# Patient Record
Sex: Female | Born: 1990 | Race: Asian | Hispanic: No | Marital: Single | Smoking: Never smoker
Health system: Southern US, Community
[De-identification: ages and names within clinical notes are randomized; demographics above are authoritative.]

---

## 2015-10-14 ENCOUNTER — Ambulatory Visit (INDEPENDENT_AMBULATORY_CARE_PROVIDER_SITE_OTHER): Payer: Self-pay

## 2015-10-14 ENCOUNTER — Ambulatory Visit (INDEPENDENT_AMBULATORY_CARE_PROVIDER_SITE_OTHER): Payer: Self-pay | Admitting: Urgent Care

## 2015-10-14 VITALS — BP 122/72 | HR 76 | Temp 98.2°F | Resp 17 | Ht 62.5 in | Wt 120.0 lb

## 2015-10-14 DIAGNOSIS — R059 Cough, unspecified: Secondary | ICD-10-CM

## 2015-10-14 DIAGNOSIS — R05 Cough: Secondary | ICD-10-CM

## 2015-10-14 DIAGNOSIS — R0789 Other chest pain: Secondary | ICD-10-CM

## 2015-10-14 LAB — POCT CBC
GRANULOCYTE PERCENT: 56.8 % (ref 37–80)
HCT, POC: 36.3 % — AB (ref 37.7–47.9)
Hemoglobin: 13.1 g/dL (ref 12.2–16.2)
Lymph, poc: 1.9 (ref 0.6–3.4)
MCH, POC: 31.7 pg — AB (ref 27–31.2)
MCHC: 36.1 g/dL — AB (ref 31.8–35.4)
MCV: 87.7 fL (ref 80–97)
MID (CBC): 0.4 (ref 0–0.9)
MPV: 7.2 fL (ref 0–99.8)
PLATELET COUNT, POC: 331 10*3/uL (ref 142–424)
POC Granulocyte: 3.1 (ref 2–6.9)
POC LYMPH %: 35.5 % (ref 10–50)
POC MID %: 7.7 %M (ref 0–12)
RBC: 4.13 M/uL (ref 4.04–5.48)
RDW, POC: 12.2 %
WBC: 5.4 10*3/uL (ref 4.6–10.2)

## 2015-10-14 MED ORDER — FAMOTIDINE 20 MG PO TABS: 20.0000 mg | ORAL_TABLET | Freq: Two times a day (BID) | ORAL | Status: DC

## 2015-10-14 NOTE — Progress Notes (Signed)
    MRN: [id number] DOB: 03/12/1991  Subjective:   Patient is presenting with her cousin, Hannah Munoz, who is translating for Hannah Munoz since patient speaks [place] only.  Hannah Munoz is a 25 y.o. female presenting for chief complaint of Chest Pain  Reports 1 week history of mid-sternal chest pain, shob. Her chest pain radiates to her back, constant, sharp in nature. Has an occasional cough, elicits chest pain. Denies fever, hemoptysis, night sweats, weight loss, n/v, abdominal pain. Patient went to Urgent Care in [place], was prescribed Anaprox which made her symptoms worse. Patient recently moved here from [place], has been here for 2 months. Eats spicy foods, rice, fried foods. Denies smoking cigarettes.  Hannah Munoz has a current medication list which includes the following prescription(s): naproxen. Also has No Known Allergies.  Hannah Munoz  has no past medical history on file. Also  has no past surgical history on file.  Denies family history of cancer, diabetes, HTN, HL, heart disease, stroke, mental illness.   Objective:   Vitals: BP 122/72 mmHg  Pulse 76  Temp(Src) 98.2 F (36.8 C) (Oral)  Resp 17  Ht 5' 2.5" (1.588 m)  Wt 120 lb (54.432 kg)  BMI 21.59 kg/m2  SpO2 99%  LMP 09/26/2015  Physical Exam  Constitutional: She is oriented to person, place, and time. She appears well-developed and well-nourished.  HENT:  Mouth/Throat: Oropharynx is clear and moist.  Eyes: No scleral icterus.  Neck: Normal range of motion. Neck supple.  Cardiovascular: Normal rate, regular rhythm and intact distal pulses.  Exam reveals no gallop and no friction rub.   No murmur heard. Pulmonary/Chest: No respiratory distress. She has no wheezes. She has no rales.  Abdominal: Soft. Bowel sounds are normal. She exhibits no distension and no mass. There is no tenderness.  Neurological: She is alert and oriented to person, place, and time.  Skin: Skin is warm and dry.   Results for orders placed or performed in  visit on 10/14/15 (from the past 24 hour(s))  POCT CBC     Status: Abnormal   Collection Time: 10/14/15 10:42 AM  Result Value Ref Range   WBC 5.4 4.6 - 10.2 K/uL   Lymph, poc 1.9 0.6 - 3.4   POC LYMPH PERCENT 35.5 10 - 50 %L   MID (cbc) 0.4 0 - 0.9   POC MID % 7.7 0 - 12 %M   POC Granulocyte 3.1 2 - 6.9   Granulocyte percent 56.8 37 - 80 %G   RBC 4.13 4.04 - 5.48 M/uL   Hemoglobin 13.1 12.2 - 16.2 g/dL   HCT, POC 40.936.3 (A) 81.137.7 - 47.9 %   MCV 87.7 80 - 97 fL   MCH, POC 31.7 (A) 27 - 31.2 pg   MCHC 36.1 (A) 31.8 - 35.4 g/dL   RDW, POC 91.412.2 %   Platelet Count, POC 331 142 - 424 K/uL   MPV 7.2 0 - 99.8 fL   No results found.   Assessment and Plan :   1. Atypical chest pain - Suspect that she is having GERD, will have patient start Pepcid, make dietary modifications. Recheck in 2-4 weeks. Hannah DusterMichelle, patient's cousin can be reached at [phone] for results.  Hannah BambergMario Jolan Mealor, PA-C [facility] [phone] 10/14/2015 10:29 AM

## 2015-10-14 NOTE — Patient Instructions (Addendum)
B?nh tro ng??c d? dy th?c qu?n, Ng??i l?n (Gastroesophageal Reflux Disease, Adult) Thng th??ng, th?c ?n di chuy?n xu?ng th?c qu?n v ? l?i d? dy ?? tiu ha. Tuy nhin, khi m?t ng??i b? b?nh tro ng??c d? dy th?c qu?n (GERD), th?c ?n v a xt trong d? dy tro ng??c tr? l?i th?c qu?n. Khi tnh tr?ng ny x?y ra, th?c qu?n b? lot v vim. D?n d?n, GERD c th? t?o ra nh?ng l? nh? (v?t lot) trn l?p nim m?c th?c qu?n.  NGUYN NHN Tnh tr?ng ny gy ra b?i m?t v?n ?? c?a ph?n c? gi?a th?c qu?n v d? dy (c? th?t th?c qu?n d??i, hay LES). Thng th??ng c? LES ?ng l?i sau khi th?c ?n ?i qua th?c qu?n vo d? dy. Khi LES b? y?u ho?c b?t th??ng, c? khng ?ng theo ?ng cch v ?i?u ? cho php th?c ?n v a xt d? dy tro ng??c tr? l?i th?c qu?n. LES c th? b? y?u do m?t s? ch?t ?n king nh?t ??nh, thu?c v cc tnh tr?ng b?nh l, bao g?m:  S? d?ng thu?c l.  Mang thai.  Thot v? honh.  S? d?ng nhi?u r??u.  M?t s? lo?i th?c ?n v ?? u?ng nh?t ??nh, nh? c ph, s c la, hnh v b?c h. CC Y?U T? NGUY C? Tnh tr?ng ny hay x?y ra h?n ?:  Nh?ng ng??i t?ng cn.  Nh?ng ng??i c cc b?nh ? m lin k?t.  Nh?ng ng??i s? d?ng thu?c NSAID. TRI?U CH?NG Nh?ng tri?u ch?ng c?a tnh tr?ng ny bao g?m:  ? nng.  Kh nu?t ho?c ?au khi nu?t.  C?m th?y nh? c m?t kh?i c?c trong c? h?ng.  C?m gic ??ng trong mi?ng.  H?i th? hi.  C nhi?u n??c b?t.  C?m gic kh ch?u trong b?ng ho?c ch??ng b?ng.  ? h?i.  ?au ng?c.  Kh th? ho?c th? kh kh.  Ho lin t?c (m?n tnh) ho?c ho vo ban ?m.  B? h?ng l?p men r?ng.  S?t cn. Nh?ng tnh tr?ng khc nhau c th? gy ?au ng?c. B?o ??m ph?i ??n khm chuyn gia ch?m [place] s?c kh?e n?u qu v? b? ?au ng?c. CH?N ?ON Chuyn gia ch?m [place] s?c kh?e c?a qu v? s? h?i v? b?nh s? v khm th?c th? cho qu v?. ?? xc ??nh qu v? b? GERD nh? hay n?ng, chuyn gia ch?m [place] s?c kh?e c?ng c th? theo di qu v? ?p ?ng v?i vi?c ?i?u tr? nh? th? no. Qu v? c?ng  c th? ph?i lm cc ki?m tra khc, bao g?m:  N?i soi ?? ki?m tra d? dy v th?c qu?n b?ng m?t camera nh?.  Ki?m tra ?o n?ng ?? a xt trong th?c qu?n c?a qu v?.  Ki?m tra ?o m?c p l?c ln th?c qu?n c?a qu v?.  Nu?t bari ho?c nu?t bari ?i?u ch?nh ?? hi?n th? hnh dng, kch th??c v ch?c n?ng c?a th?c qu?n c?a qu v?. ?I?U TR? M?c tiu c?a ?i?u tr? l gip gi?m cc tri?u ch?ng v trnh bi?n ch?ng. Vi?c ?i?u tr? b?nh ny c th? khc nhau ty thu?c m?c ?? n?ng c?a tri?u ch?ng. Chuyn gia ch?m [place] s?c kh?e c?a qu v? c th? khuy?n ngh?:  Thay ??i ch? ?? ?n.  Thu?c.  Ph?u thu?t. H??NG D?N CH?M [place] T?I NH Ch? ?? ?n  Tun th? m?t ch? ?? ?n theo khuy?n ngh? c?a chuyn gia ch?m [place] s?c kh?e. Vi?c ny c th? l  trnh cc th?c ?n v ?? u?ng nh?:  C ph v tr (c ho?c khng c caffeine).  ?? u?ng c ch?ar??u.  ?? u?ng t?ng l?c v ?? u?ng dng trong th? thao.  ?? u?ng c ga ho?c soda.  S c la v c ca.  B?c h v h??ng v? b?c h.  T?i v hnh.  C?i ng?a (Horseradish).  Cc th?c ?n nhi?u gia v? v a xt, bao g?m h?t tiu, b?t ?t, b?t ca ri, gi?m, n??c s?t cay, v n??c s?t barbecue.  N??c qu? ho?c qu? h? cam qut, ch?ng h?n nh? cam, chanh v chanh l cam.  Cc th?c ?n c c chua, nh? n??c x?t ??, ?t, n??c x?t salsa, v pizza km x?t ??  Th?c ?n chin v nhi?u ch?t bo, ch?ng h?n nh? bnh rn, khoai ty chin, khoai ty rn v n??c x?t nhi?u ch?t bo.  Th?t nhi?u ch?t bo, ch?ng h?n nh? hot dog (bnh m k?p xc xch) v cc lo?i th?t ?? v tr?ng nhi?u m?, ch?ng h?n nh? th?t n?c l?ng, xc xch, gi?m bng v th?t l?n xng khi.  Nh?ng s?n ph?m b? s?a giu ch?t bo, nh? s?a nguyn kem, b? v pho mt kem.  ?n cc b?a nh?, th??ng xuyn thay v cc b?a no.  Trnh u?ng nhi?u n??c khi qu v? ?n.  Trnh ?n trong kho?ng 2-3 gi? tr??c khi ?i ng?.  Trnh n?m xu?ng ngay sau khi ?n.  Khngt?p th? d?c ngay sau khi ?n. H??ng d?n chung  Ch  ??n nh?ng thay ??i v? tri?u ch?ng  c?a qu v?.  Ch? s? d?ng thu?c khng c?n k ??n v thu?c c?n k ??n theo ch? d?n c?a chuyn gia ch?m [place] s?c kh?e. Khng dng aspirin, ibuprofen, ho?c cc thu?c NSAID khc tr? khi chuyn gia ch?m [place] s?c kh?e c?a qu v? cho php.  Khng s? d?ng b?t k? s?n ph?m thu?c l no, bao g?m thu?c l d?ng ht, thu?c l d?ng nhai v thu?c l ?i?n t?. N?u qu v? c?n gip ?? ?? cai thu?c, hy h?i chuyn gia ch?m [place] s?c kh?e.  M?c qu?n o r?ng. Khng m?c ci g ch?t quanh eo m c th? t?o p l?c ln b?ng.  Nng (nng cao) ??u gi??ng c?a qu v? thm 6 inch (15 cm).  C? g?ng gi?m c?ng th?ng, ch?ng h?n nh? t?p yoga ho?c thi?n. N?u qu v? c?n gip ?? ?? gi?m c?ng th?ng, hy h?i chuyn gia ch?m [place] s?c kh?e.  N?u qu v? th?a cn, hy gi?m cn n?ng v? m?c c l?i cho s?c kh?e c?a qu v?. Hy h?i chuyn gia ch?m [place] s?c kh?e ?? ???c h??ng d?n v? m?c tiu gi?m cn an ton.  Tun th? t?t c? cc cu?c h?n khm l?i theo ch? d?n c?a chuyn gia ch?m [place] s?c kh?e. ?i?u ny c vai tr quan tr?ng. ?I KHM N?U:  Qu v? c cc tri?u ch?ng m?i.  Qu v? b? s?t cn khng r nguyn nhn.  Qu v? b? kh nu?t ho?c b? ?au khi nu?t.  Qu v? th? kh kh ho?c ho dai d?ng.  Cc tri?u ch?ng c?a qu v? khng c?i thi?n sau khi ???c ?i?u tr?Sander Nephew v? b? kh?n gi?ng. NGAY L?P T?C ?I KHM N?U:  Qu v? b? ?au ? cnh tay, c?, hm, r?ng ho?c l?ng.  Qu v? th?y ?? m? hi, chng m?t ho?c chong vng.  Qu v? b? ?au ng?c ho?c kh th?.  Qu v? nn v ch?t nn ra gi?ng nh? mu ho?c b c ph.  Qu v? b? ng?t.  Phn c?a qu v? c mu ho?c mu ?en.  Qu v? khng th? nu?t, u?ng hay ?n.   Thng tin ny khng nh?m m?c ?ch thay th? cho l?i khuyn m chuyn gia ch?m [place] s?c kh?e ni v?i qu v?. Hy b?o ??m qu v? ph?i th?o lu?n b?t k? v?n ?? g m qu v? c v?i chuyn gia ch?m [place] s?c kh?e c?a qu v?.   Document Released: 01/16/2005 Document Revised: 12/28/2014 Elsevier Interactive Patient Education [employer]2016 Elsevier Inc.     IF  you received an x-ray today, you will receive an invoice from [facility]. Please contact [facility] at [phone] with questions or concerns regarding your invoice.   IF you received labwork today, you will receive an invoice from [facility]. Please contact [facility] at [phone] with questions or concerns regarding your invoice.   Our billing staff will not be able to assist you with questions regarding bills from these companies.  You will be contacted with the lab results as soon as they are available. The fastest way to get your results is to activate your [facility] account. Instructions are located on the last page of this paperwork. If you have not heard from us regarding the results in 2 weeks, please contact this office.

## 2015-10-25 ENCOUNTER — Emergency Department (HOSPITAL_BASED_OUTPATIENT_CLINIC_OR_DEPARTMENT_OTHER): Payer: Self-pay

## 2015-10-25 ENCOUNTER — Emergency Department (HOSPITAL_BASED_OUTPATIENT_CLINIC_OR_DEPARTMENT_OTHER)
Admission: EM | Admit: 2015-10-25 | Discharge: 2015-10-25 | Disposition: A | Discharge status: Home / Self Care | Payer: Self-pay | Attending: Emergency Medicine | Admitting: Emergency Medicine

## 2015-10-25 ENCOUNTER — Encounter (HOSPITAL_BASED_OUTPATIENT_CLINIC_OR_DEPARTMENT_OTHER): Payer: Self-pay | Admitting: Emergency Medicine

## 2015-10-25 DIAGNOSIS — R0789 Other chest pain: Secondary | ICD-10-CM | POA: Insufficient documentation

## 2015-10-25 LAB — CBC WITH DIFFERENTIAL/PLATELET
Basophils Absolute: 0 10*3/uL (ref 0.0–0.1)
Basophils Relative: 1 %
Eosinophils Absolute: 0.4 10*3/uL (ref 0.0–0.7)
Eosinophils Relative: 7 %
HCT: 39.3 % (ref 36.0–46.0)
Hemoglobin: 13.5 g/dL (ref 12.0–15.0)
Lymphocytes Relative: 36 %
Lymphs Abs: 2.2 10*3/uL (ref 0.7–4.0)
MCH: 30.9 pg (ref 26.0–34.0)
MCHC: 34.4 g/dL (ref 30.0–36.0)
MCV: 89.9 fL (ref 78.0–100.0)
Monocytes Absolute: 0.5 10*3/uL (ref 0.1–1.0)
Monocytes Relative: 8 %
Neutro Abs: 2.9 10*3/uL (ref 1.7–7.7)
Neutrophils Relative %: 48 %
Platelets: 356 10*3/uL (ref 150–400)
RBC: 4.37 MIL/uL (ref 3.87–5.11)
RDW: 11.2 % — ABNORMAL LOW (ref 11.5–15.5)
WBC: 5.9 10*3/uL (ref 4.0–10.5)

## 2015-10-25 LAB — COMPREHENSIVE METABOLIC PANEL
ALT: 13 U/L — ABNORMAL LOW (ref 14–54)
AST: 17 U/L (ref 15–41)
Albumin: 4.3 g/dL (ref 3.5–5.0)
Alkaline Phosphatase: 52 U/L (ref 38–126)
Anion gap: 7 (ref 5–15)
BUN: 10 mg/dL (ref 6–20)
CO2: 29 mmol/L (ref 22–32)
Calcium: 9.1 mg/dL (ref 8.9–10.3)
Chloride: 101 mmol/L (ref 101–111)
Creatinine, Ser: 0.54 mg/dL (ref 0.44–1.00)
GFR calc Af Amer: >60 mL/min (ref >60)
GFR calc non Af Amer: >60 mL/min (ref >60)
Glucose, Bld: 89 mg/dL (ref 65–99)
Potassium: 3.7 mmol/L (ref 3.5–5.1)
Sodium: 137 mmol/L (ref 135–145)
Total Bilirubin: 0.6 mg/dL (ref 0.3–1.2)
Total Protein: 7.9 g/dL (ref 6.5–8.1)

## 2015-10-25 LAB — LIPASE, BLOOD: Lipase: 15 U/L (ref 11–51)

## 2015-10-25 LAB — TROPONIN I: Troponin I: <0.03 ng/mL (ref <0.03)

## 2015-10-25 MED ORDER — OMEPRAZOLE 20 MG PO CPDR
20.0000 mg | DELAYED_RELEASE_CAPSULE | Freq: Every day | ORAL | Status: DC
Start: 2015-10-25 — End: 2016-02-19

## 2015-10-25 MED ORDER — GI COCKTAIL ~~LOC~~
30.0000 mL | Freq: Once | ORAL | Status: AC
Start: 2015-10-25 — End: 2015-10-25
  Administered 2015-10-25: 30 mL via ORAL
  Filled 2015-10-25: qty 30

## 2015-10-25 NOTE — ED Notes (Addendum)
Pt c/o mid chest pain x 2 weeks. Pt seen at UC 2 weeks ago had neg blood work and neg chest xray, pain persists. Denies N/V, SOB. Pt was given pepcid, pt states it is not working.

## 2015-10-25 NOTE — ED Notes (Signed)
Patient transported to X-ray 

## 2015-10-25 NOTE — ED Notes (Signed)
Pt's family requesting ultrasound of the chest and abdomen.  PA notified and plans to speak to pt.

## 2015-10-25 NOTE — ED Provider Notes (Signed)
CSN: [id number]     Arrival date & time 10/25/15  0935 History   First MD Initiated Contact with Patient 10/25/15 [phone]     Chief Complaint  Patient presents with  . Chest Pain   HPI patient's cousin uses as translator   25 year old female presents today with complaints of upper abdomen and chest pain. Patient reports symptoms have been present for approximately 3 weeks, persistent, with no changes. Patient notes that she was seen in urgent care, was prescribed Anaprox which upset her stomach and did not relieve her pain. She followed up at urgent care again on 10/14/2015. At that time she was started on Pepcid with scheduled follow-up in 2-4 weeks.   Patient reports that since her last visit symptoms have persisted with no relief. Patient denies any exacerbating factors including eating and drinking, deep inspiration. Patient does report minor tenderness to palpation of the sternum, no abdominal tenderness. No history of the same. Nonsmoker, no significant cardiac personal or family history. Pt reports a cousin with a history of lung cancer.   She denies any fever, chills, cough, nausea vomiting, diaphoresis, dizziness, blood per rectum, dark stools, lower extremity swelling edema, recent surgery, malignancy, significant weight loss, night sweats, or any DVT/PE risk factors.   History reviewed. No pertinent past medical history. History reviewed. No pertinent past surgical history. No family history on file. Social History  Substance Use Topics  . Smoking status: Never Smoker   . Smokeless tobacco: None  . Alcohol Use: No   OB History    No data available     Review of Systems  All other systems reviewed and are negative.   Allergies  Review of patient's allergies indicates no known allergies.  Home Medications   Prior to Admission medications   Medication Sig Start Date End Date Taking? Authorizing Provider  famotidine (PEPCID) 20 MG tablet Take 1 tablet (20 mg total) by mouth 2  (two) times daily. 10/14/15   Wallis BambergMario Mani, PA-C  omeprazole (PRILOSEC) 20 MG capsule Take 1 capsule (20 mg total) by mouth daily. 10/25/15   Eyvonne MechanicJeffrey Lailoni Baquera, PA-C   BP 91/59 mmHg  Pulse 68  Temp(Src) 98.4 F (36.9 C) (Oral)  Resp 11  SpO2 100%  LMP 10/12/2015   Physical Exam  Constitutional: She is oriented to person, place, and time. She appears well-developed and well-nourished.  HENT:  Head: Normocephalic and atraumatic.  Mouth/Throat: Uvula is midline, oropharynx is clear and moist and mucous membranes are normal. No oropharyngeal exudate, posterior oropharyngeal edema or posterior oropharyngeal erythema.  Eyes: Conjunctivae are normal. Pupils are equal, round, and reactive to light. Right eye exhibits no discharge. Left eye exhibits no discharge. No scleral icterus.  Neck: Normal range of motion. No JVD present. No tracheal deviation present.  Cardiovascular: Normal rate, regular rhythm, normal heart sounds and intact distal pulses.  Exam reveals no gallop and no friction rub.   No murmur heard. Pulmonary/Chest: Effort normal. No stridor. No respiratory distress. She has no wheezes. She has no rales. She exhibits no tenderness.  TTP of sternum, no obvious abnormality   Abdominal: Soft. She exhibits no distension and no mass. There is no tenderness. There is no rebound and no guarding.  Musculoskeletal: She exhibits no edema or tenderness.  Neurological: She is alert and oriented to person, place, and time. Coordination normal.  Skin: Skin is warm and dry. No rash noted. No erythema. No pallor.  Psychiatric: She has a normal mood and affect. Her behavior is  normal. Judgment and thought content normal.  Nursing note and vitals reviewed.   ED Course  Procedures (including critical care time) Labs Review Labs Reviewed  CBC WITH DIFFERENTIAL/PLATELET - Abnormal; Notable for the following:    RDW 11.2 (*)    All other components within normal limits  COMPREHENSIVE METABOLIC PANEL -  Abnormal; Notable for the following:    ALT 13 (*)    All other components within normal limits  LIPASE, BLOOD  TROPONIN I    Imaging Review Dg Chest 2 View  10/25/2015  CLINICAL DATA:  Chest pain. EXAM: CHEST  2 VIEW COMPARISON:  10/14/2015. FINDINGS: Mediastinum and hilar structures normal. Lungs are clear. Heart size normal. No pleural effusion or pneumothorax. Degenerative changes thoracic spine. IMPRESSION: No acute or focal abnormality. Electronically Signed   By: Maisie Fushomas  Register   On: 10/25/2015 10:15   I have personally reviewed and evaluated these images and lab results as part of [facility] medical decision-making.   EKG Interpretation   Date/Time:  Wednesday October 25 2015 09:59:37 EDT Ventricular Rate:  70 PR Interval:    QRS Duration: 92 QT Interval:  397 QTC Calculation: 429 R Axis:   89 Text Interpretation:  Sinus rhythm Benign early repolarization No acute  ischemic changes  Confirmed by LIU MD, DANA ([id number]) on 10/25/2015 10:03:39  AM      MDM   Final diagnoses:  Chest wall pain    Labs: CBC, CMP,Lipase,   Imaging: DG chest  Consults:  Therapeutics:GI cocktail  Discharge Meds: Prilosec  Assessment/Plan:  25 year old female presents today with complaints of chest pain. Patient's presentation is most consistent with chest wall pain. She has tenderness to palpation of the sternum. She has no significant risk factors for ACS, PE or DVT. Patient is afebrile, nontoxic in no acute distress. Low suspicion for any infectious etiology. Chest x-ray shows no significant findings, labs reassuring. Patient's pain was not improved with GI cocktail. She denies any indigestion or history of acid reflux. Patient will be instructed to use Tylenol as needed for pain, she'll be started on a PPI in the event this indigestion. Patient will be referred to cardiology for continued workup of her atypical chest pain. Patient has no signs or symptoms consistent with dissection, or any acute  life-threatening intrathoracic etiology. Patient given strict return precautions, she verbalized understanding and agreement to today's plan had no further questions or concerns at the time discharge.  I was called back into the room By the patient with more questioning. Patient concerned that she has lung cancer as she has a distant relative that once had lung cancer. Patient is a nonsmoker, no risk factors, no weight loss, fever, night sweats, or any other concerning signs or symptoms that would indicate malignant etiology. She has no masses on her chest or along the lateral aspect of her breasts. Plain films here showed no gross abnormality. Labs showed no abnormalities. Patient requesting ultrasound of the chest, informed her that she would need follow-up with her primary care provider for continued workup. Patient requested EGD to look at her esophagus, informed her that that was not a capability of the emergency room and that she needed to follow up with her primary care or gastroenterologist for further management.         Eyvonne MechanicJeffrey Haylen Shelnutt, PA-C 10/25/15 1128  Lavera Guiseana Duo Liu, MD 10/25/15 [phone]

## 2015-10-25 NOTE — ED Notes (Signed)
PA at bedside.

## 2015-10-25 NOTE — Discharge Instructions (Signed)
Please follow-up with primary care provider, and cardiologist for further evaluation of your chest pain. If you experience any new or worsening signs or symptoms please return immediately.   Chest Wall Pain Chest wall pain is pain in or around the bones and muscles of your chest. Sometimes, an injury causes this pain. Sometimes, the cause may not be known. This pain may take several weeks or longer to get better. HOME CARE INSTRUCTIONS  Pay attention to any changes in your symptoms. Take these actions to help with your pain:   Rest as told by your health care provider.   Avoid activities that cause pain. These include any activities that use your chest muscles or your abdominal and side muscles to lift heavy items.   If directed, apply ice to the painful area:  Put ice in a plastic bag.  Place a towel between your skin and the bag.  Leave the ice on for 20 minutes, 2-3 times per day.  Take over-the-counter and prescription medicines only as told by your health care provider.  Do not use tobacco products, including cigarettes, chewing tobacco, and e-cigarettes. If you need help quitting, ask your health care provider.  Keep all follow-up visits as told by your health care provider. This is important. SEEK MEDICAL CARE IF:  You have a fever.  Your chest pain becomes worse.  You have new symptoms. SEEK IMMEDIATE MEDICAL CARE IF:  You have nausea or vomiting.  You feel sweaty or light-headed.  You have a cough with phlegm (sputum) or you cough up blood.  You develop shortness of breath.   This information is not intended to replace advice given to you by your health care provider. Make sure you discuss any questions you have with your health care provider.   Document Released: 04/08/2005 Document Revised: 12/28/2014 Document Reviewed: 07/04/2014 Elsevier Interactive Patient Education [employer]2016 Elsevier Inc.

## 2016-02-19 ENCOUNTER — Encounter: Payer: Self-pay | Admitting: Family Medicine

## 2016-02-19 ENCOUNTER — Ambulatory Visit (INDEPENDENT_AMBULATORY_CARE_PROVIDER_SITE_OTHER): Payer: BLUE CROSS/BLUE SHIELD | Admitting: Family Medicine

## 2016-02-19 VITALS — BP 96/58 | HR 69 | Temp 97.9°F | Ht 63.0 in | Wt 124.2 lb

## 2016-02-19 DIAGNOSIS — Z23 Encounter for immunization: Secondary | ICD-10-CM | POA: Diagnosis not present

## 2016-02-19 DIAGNOSIS — Z1159 Encounter for screening for other viral diseases: Secondary | ICD-10-CM | POA: Diagnosis not present

## 2016-02-19 DIAGNOSIS — R071 Chest pain on breathing: Secondary | ICD-10-CM

## 2016-02-19 DIAGNOSIS — Z114 Encounter for screening for human immunodeficiency virus [HIV]: Secondary | ICD-10-CM | POA: Diagnosis not present

## 2016-02-19 DIAGNOSIS — R0789 Other chest pain: Secondary | ICD-10-CM | POA: Insufficient documentation

## 2016-02-19 DIAGNOSIS — Z0000 Encounter for general adult medical examination without abnormal findings: Secondary | ICD-10-CM

## 2016-02-19 NOTE — Progress Notes (Signed)
Subjective:   Patient ID: Hannah Munoz    DOB: 30-Apr-1990, 25 y.o. female   MRN: [id number]  CC: Establishing Care  HPI: Hannah Munoz is a 25 y.o. female who presents to clinic today to establish care. Problems discussed today are as follows:  1. Preventative Care: no previous PCP when in [place]. 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 Says she received her vaccinations and wants the flu shot today. Denies ever receiving PAP screening. H/o sexual activity with boyfriend since last year but waiting until next year when he arrives in the [place] and they get married. Not on birthcontrol but open to options later next year before he arrives.   2. Musculoskeletal Chest Pain: onset 2 months ago and reproducible with palpation. Patient was seen in ED on 10/25/15 and treated with reflux medications without relief. Denies dyspnea, diaphoresis, fevers, left arm pain, nausea, vomiting.  3. Sedentary Lifestyle: patient says she walks rarely but is otherwise not active. 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 Says she has not changed her diet [place] the [place]. Avoids carb rich foods.  ROS: Complete ROS performed, see HPI for pertinent ROS.  PMFSH: Pertinent past medical, surgical, family, and social history were reviewed and updated as appropriate. Smoking status reviewed.  Medications reviewed. No current outpatient prescriptions on file.   No current facility-administered medications for this visit.     Objective:   BP (!) 96/58   Pulse 69   Temp 97.9 F (36.6 C) (Oral)   Ht 5\' 3"  (1.6 m)   Wt 124 lb 3.2 oz (56.3 kg)   SpO2 100%   BMI 22.00 kg/m  Vitals and nursing note reviewed.  General: well nourished, well developed, in no acute distress with non-toxic appearance HEENT: normocephalic, atraumatic, moist mucous membranes Neck: supple, non-tender without lymphadenopathy CV: regular rate and rhythm without murmurs, rubs, or gallops, no lower extremity edema,  chest tenderness upon palpation of central chest wall Lungs: clear to auscultation bilaterally with normal work of breathing Abdomen: soft, non-tender, non-distended, no masses or organomegaly palpable, normoactive bowel sounds Skin: warm, dry, no rashes or lesions, cap refill < 2 seconds Extremities: warm and well perfused, normal tone  Assessment & Plan:   Costochondral chest pain Uncontrolled. Onset 2 months ago. Was seen in ED but ACS r/o unremarkable with NSR EKG and neg troponin. PPI w/o relief of suspected reflux. CP reproducible on exam with palpation consistent with MSK etiology. - Tylenol (up to 3 g) or NSAIDs PRN for pain - Apply warm compress to chest wall - F/u as needed  Encounter for screening and preventative care Establishing as new pt. No previous PCP per pt. Immunizations do not indicate HBV vaccination and unsure about last Tdap. Due for annual flu. Does not have any chronic med problems or medications. No h/o PAP, not sexually active. - Flu shot given, will need Tdap during next visit (out of vaccine at present) - Discussed ways to exercise  - Educated patient on balancing a diet  - Dicussed birth control and safe sex practice - F/u in 1 month for PAP screen and Tdap vaccine  Orders Placed This Encounter  Procedures  . Flu Vaccine QUAD 36+ mos IM  . HIV antibody  . Hepatitis B surface antigen   No orders of the defined types were placed in this encounter.   Durward Parcelavid McMullen, DO [facility], PGY-1 02/19/2016 4:20 PM

## 2016-02-19 NOTE — Assessment & Plan Note (Addendum)
Establishing as new pt. No previous PCP per pt. Immunizations do not indicate HBV vaccination and unsure about last Tdap. Due for annual flu. Does not have any chronic med problems or medications. No h/o PAP, not sexually active. - Flu shot given, will need Tdap during next visit (out of vaccine at present) - Discussed ways to exercise  - Educated patient on balancing a diet  - Dicussed birth control and safe sex practice - HBsAb pending, if neg will vaccinate during next visit - F/u in 1 month for PAP screen, Tdap and HBV vaccine

## 2016-02-19 NOTE — Assessment & Plan Note (Addendum)
Uncontrolled. Onset 2 months ago. Was seen in ED but ACS r/o unremarkable with NSR EKG and neg troponin. PPI w/o relief of suspected reflux. CP reproducible on exam with palpation consistent with MSK etiology. - Tylenol (up to 3 g) or NSAIDs PRN for pain - Apply warm compress to chest wall - F/u as needed

## 2016-02-19 NOTE — Patient Instructions (Addendum)
R?t vui ???c g?p b?n hm nay. Vui lng xem bn d??i ?? xem l?i k? ho?ch c?a chng ti cho chuy?n th?m hm nay.  1. B?n ?ang lm t?t v ? nh?n ???c vaccin cm c?a b?n. Xin vui lng u?ng thu?c Tylenol ho?c Advil cho ?au ng?c c?a b?n n?u c?n. 2. Ti s? thng bo cho b?n v? k?t qu? xt nghi?m vim gan B. C th? b?n s? c?n ch?ng ng?a v?c-xin 3 m?i trong vng 6 thng. 3. Vui lng tr? l?i sau 1 thng ?? ki?m tra PAP c?a b?n.  Xin g?i cho phng m?ch t?i s? [phone] n?u cc tri?u ch?ng x?u ?i ho?c b?n c b?t c? lo ng?i no. ? l ni?m vui c?a ti khi g?p b?n. - Durward Parcelavid McMullen, DO [facility], PGY-1

## 2016-02-20 ENCOUNTER — Telehealth: Payer: Self-pay | Admitting: Family Medicine

## 2016-02-20 LAB — HIV ANTIBODY (ROUTINE TESTING W REFLEX): HIV: NONREACTIVE

## 2016-02-20 LAB — HEPATITIS B SURFACE ANTIGEN: Hepatitis B Surface Ag: NEGATIVE

## 2016-02-20 NOTE — Telephone Encounter (Signed)
Called and left voicemail using [employer] concerning hepatitis B lab results. Told the test was negative and patient could come to the clinic to get the HBV vaccination. Left clinic number to call back to schedule. -- Durward Parcelavid McMullen, DO [facility], PGY-1

## 2016-10-07 IMAGING — DX DG CHEST 2V
2 series · 2 of 2 positions shown · non-contrast
Comparison: None.

CLINICAL DATA: Left upper chest pain and shortness of breath.

EXAM:
CHEST  2 VIEW

[chest pa]
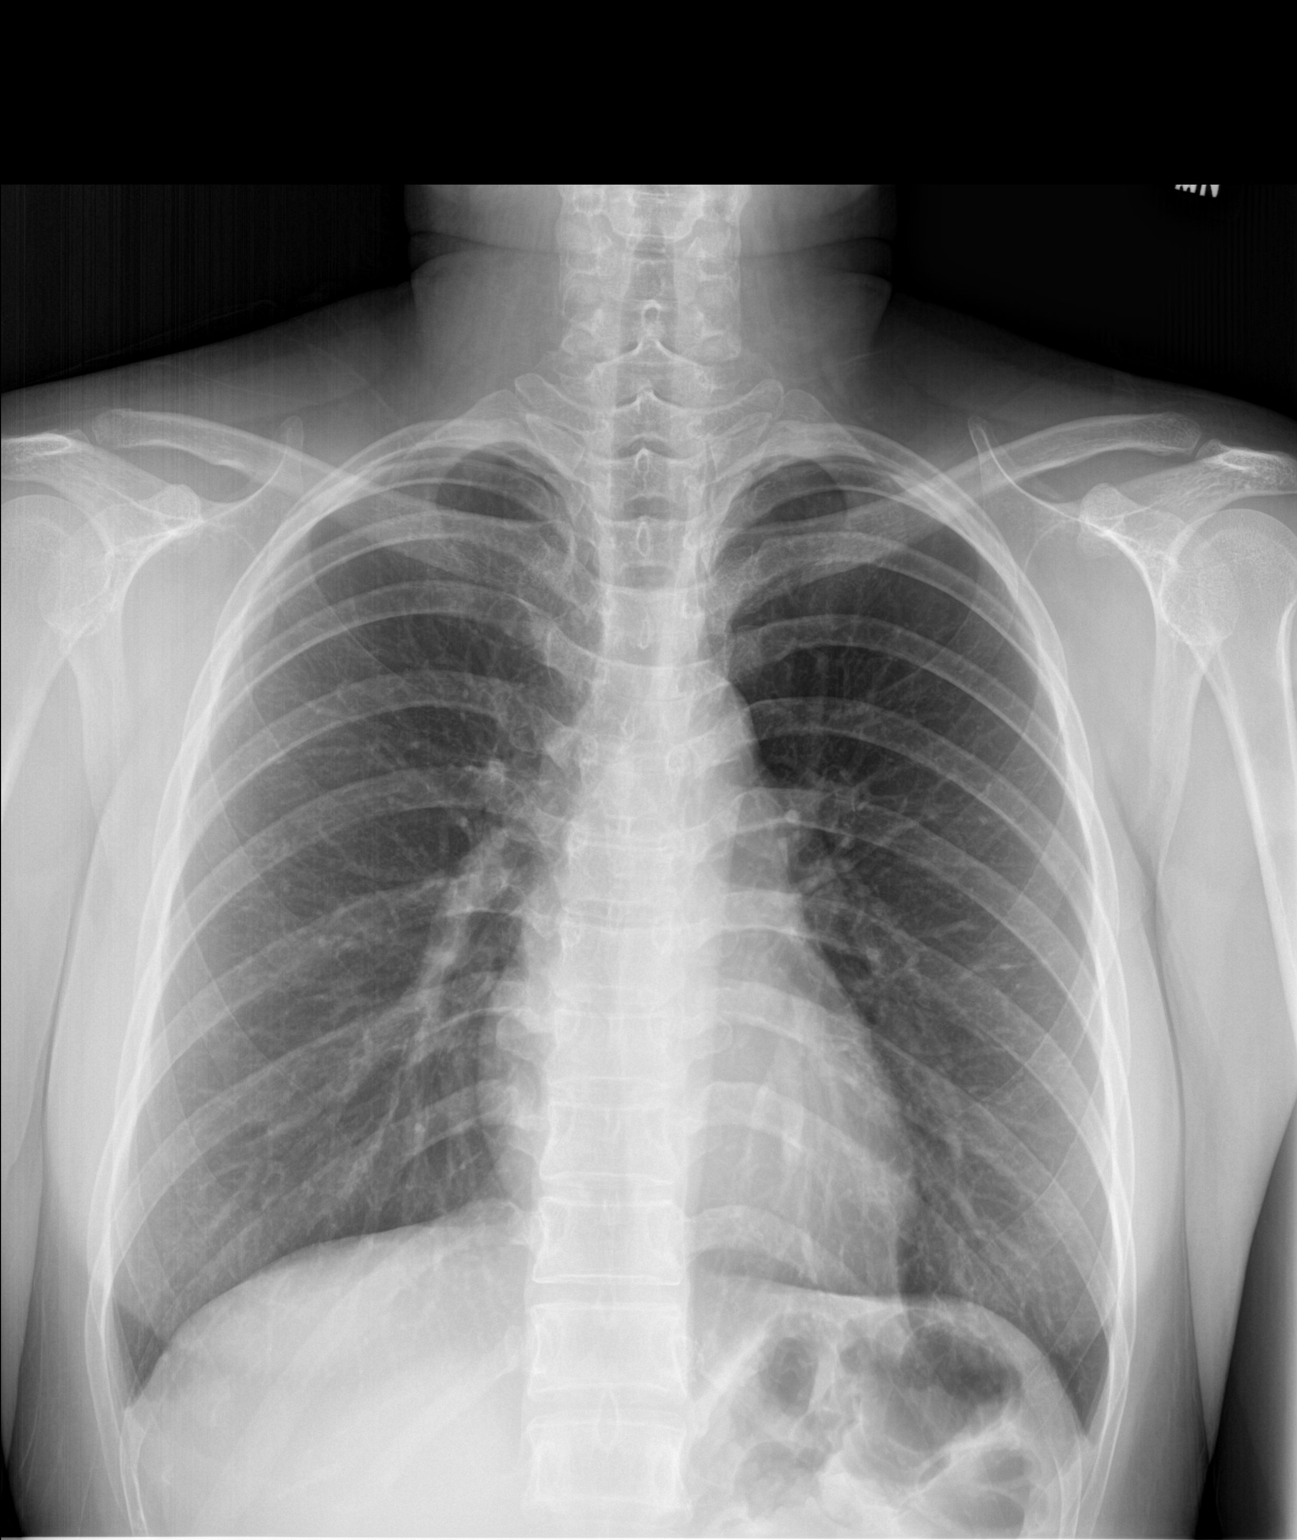

[chest lat]
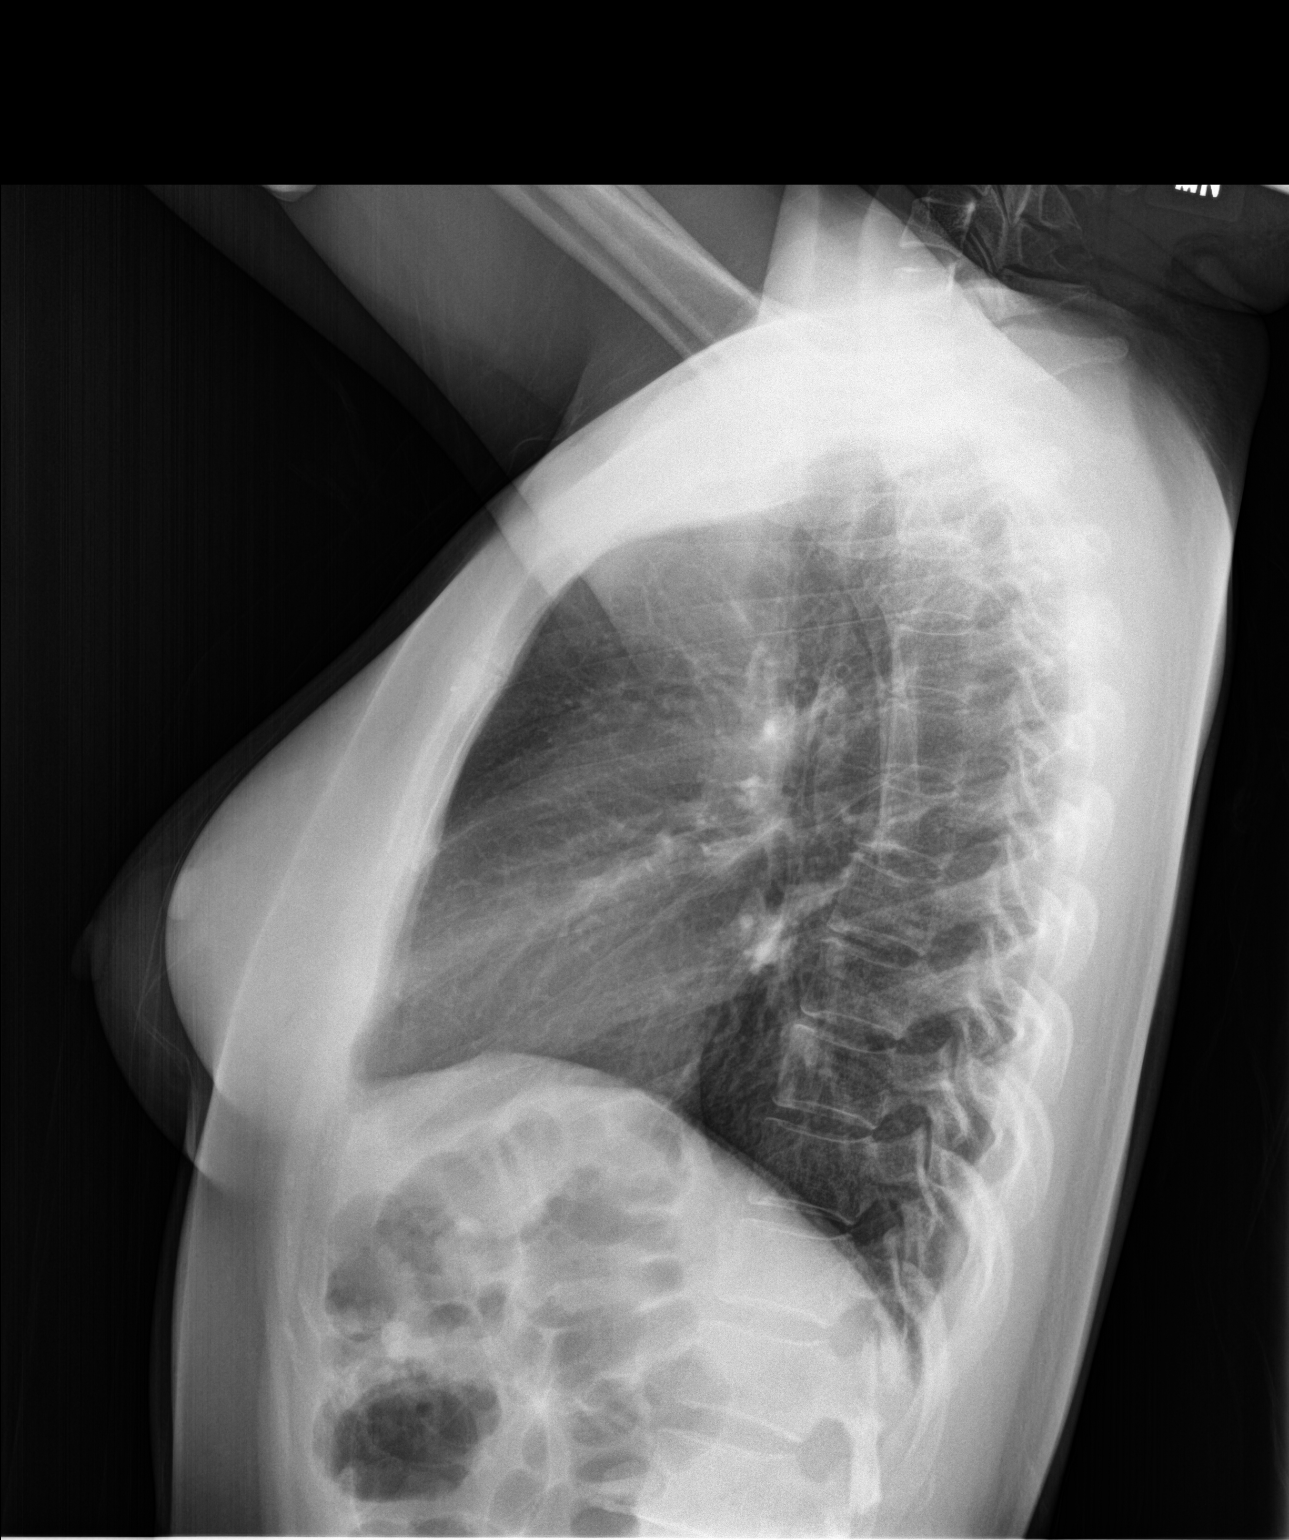

[2 of 2 positions shown; findings below may reference images not displayed]

FINDINGS: The heart size and mediastinal contours are within normal limits.
There is no evidence of pulmonary edema, consolidation,
pneumothorax, nodule or pleural fluid. The visualized skeletal
structures are unremarkable.
IMPRESSION: No active cardiopulmonary disease.

## 2016-10-18 IMAGING — DX DG CHEST 2V
2 series · 2 of 2 positions shown · non-contrast
Comparison: 10/14/2015.

CLINICAL DATA: Chest pain.

EXAM:
CHEST  2 VIEW

[chest pa]
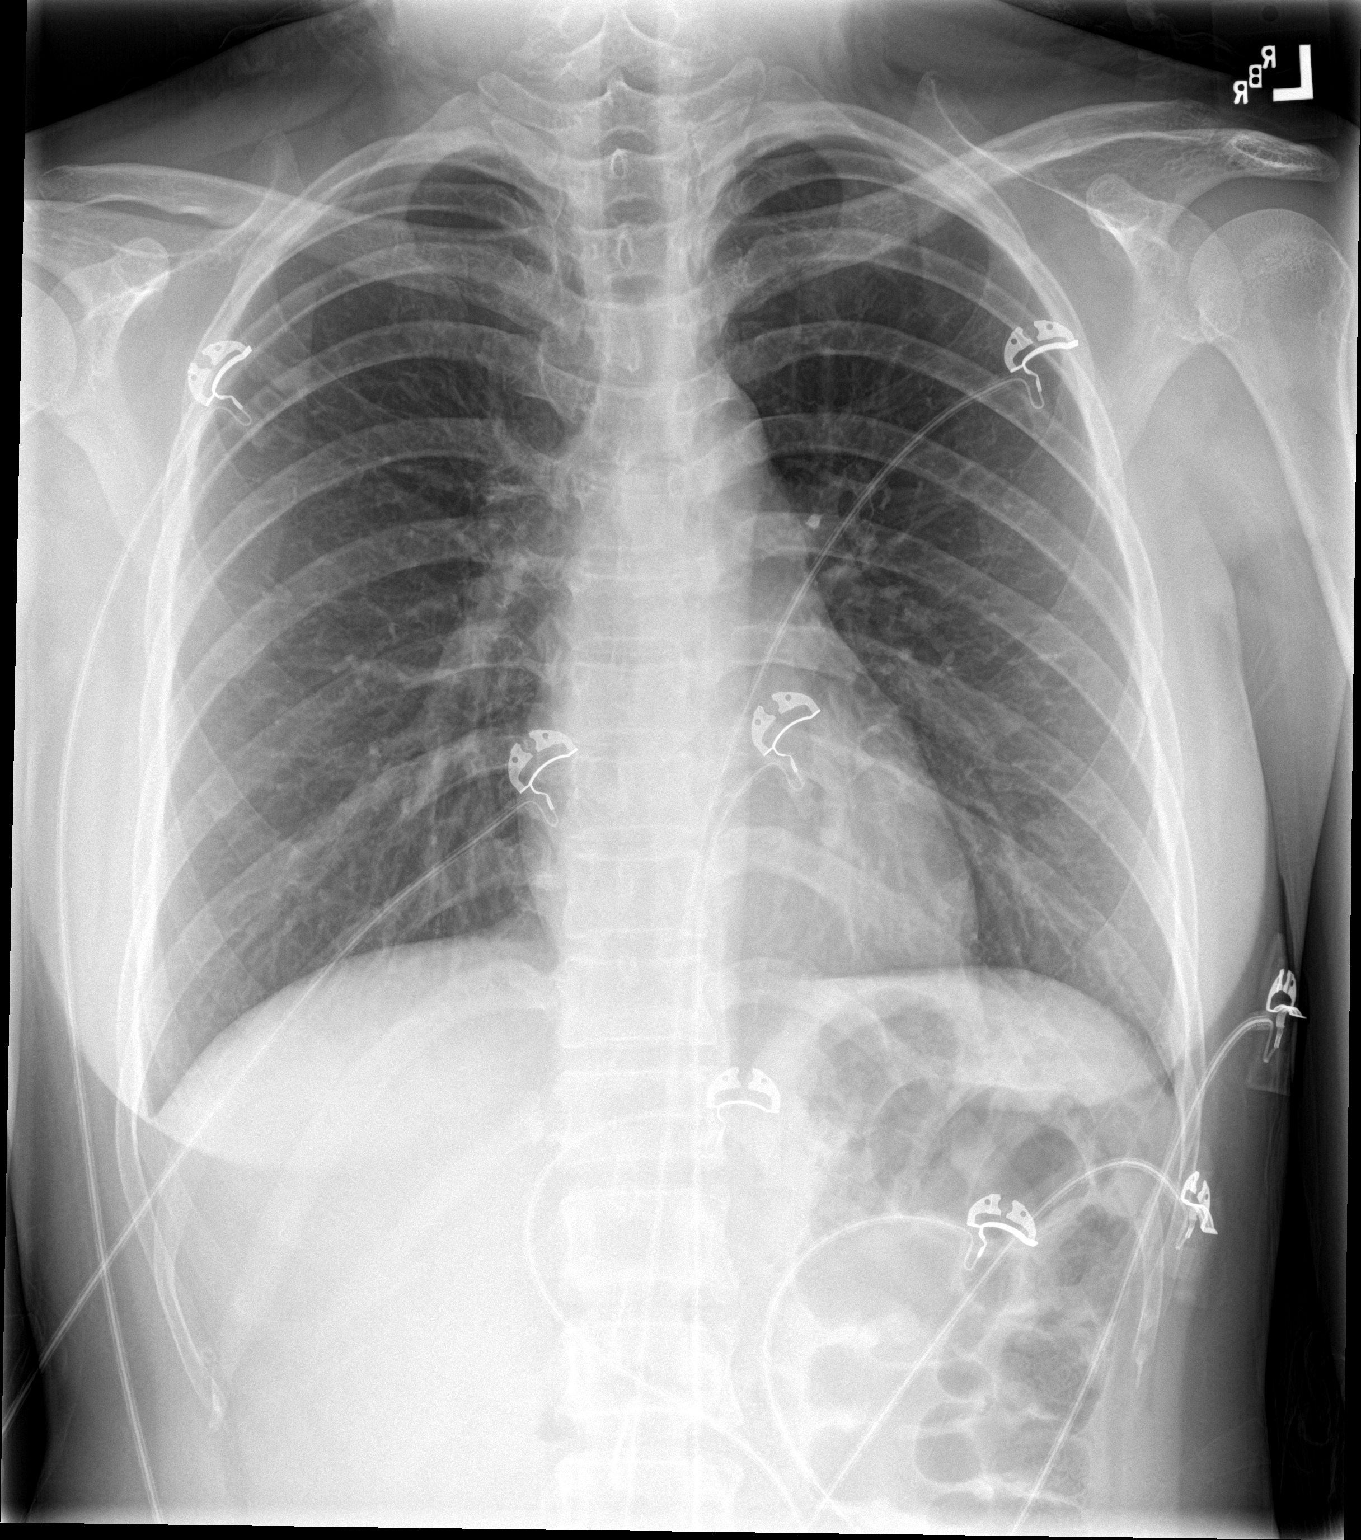

[chest lat]
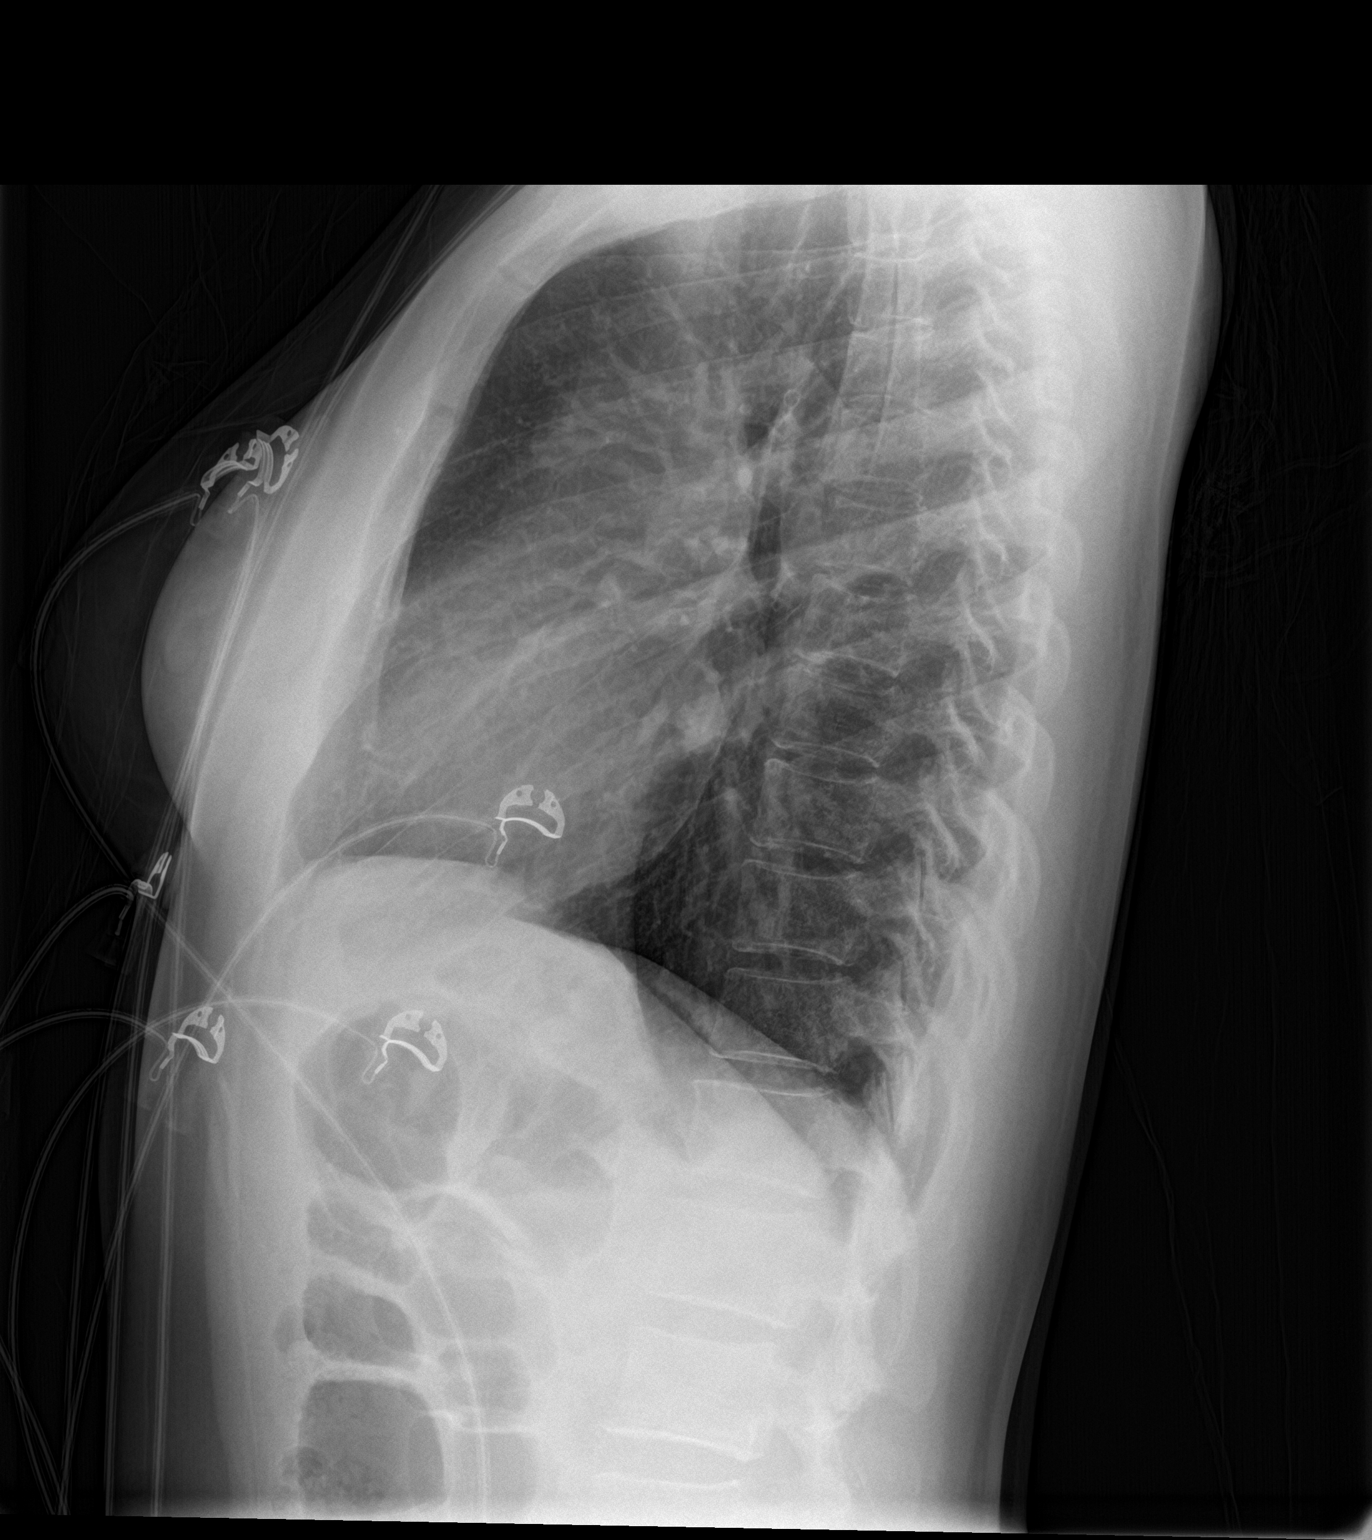

[2 of 2 positions shown; findings below may reference images not displayed]

FINDINGS: Mediastinum and hilar structures normal. Lungs are clear. Heart size
normal. No pleural effusion or pneumothorax. Degenerative changes
thoracic spine.
IMPRESSION: No acute or focal abnormality.

## 2018-03-23 ENCOUNTER — Ambulatory Visit (INDEPENDENT_AMBULATORY_CARE_PROVIDER_SITE_OTHER): Payer: BLUE CROSS/BLUE SHIELD | Admitting: Family Medicine

## 2018-03-23 VITALS — BP 99/80 | HR 86 | Temp 98.3°F | Wt 130.8 lb

## 2018-03-23 DIAGNOSIS — Z23 Encounter for immunization: Secondary | ICD-10-CM

## 2018-03-23 DIAGNOSIS — Z0000 Encounter for general adult medical examination without abnormal findings: Secondary | ICD-10-CM

## 2018-03-23 NOTE — Assessment & Plan Note (Signed)
Does have history of costochondritis.  Physical exam supports this.  Overall doing well.  Non-smoker. - Discussed conservative treatment with ibuprofen for non-cardiac chest pain - Flu shot and tetanus administered - Checking CBC and CMET per patient request

## 2018-03-23 NOTE — Progress Notes (Signed)
Subjective   Patient ID: Hannah Munoz    DOB: 06/05/1990, 27 y.o. female   MRN: [id number]  CC: "Physical exam"  HPI: Hannah Munoz is a 27 y.o. female who presents to clinic today for the following:  Physical exam: Patient here today for annual physical.  She continues to have intermittent left-sided chest pain that seems to be worse with certain positions unassociated with shortness of breath, palpitations, syncope, nausea or vomiting.  She has been seen for this complaint in the past back in 2017 which was attributed to costochondritis.  She is currently not taking medications for it.  She is due for her Pap smear but is working on today up an appointment with her gynecologist to get this done.  She would like to have blood work done to "check for sickness."  Patient would like to have a flu shot today and is due for her tetanus.  She does not exercise.  ROS: see HPI for pertinent.  PMFSH: Reviewed. Smoking status reviewed. Medications reviewed.  Objective   BP 99/80   Pulse 86   Temp 98.3 F (36.8 C)   Wt 130 lb 12.8 oz (59.3 kg)   SpO2 98%   BMI 23.17 kg/m  Vitals and nursing note reviewed.  General: well nourished, well developed, NAD with non-toxic appearance HEENT: normocephalic, atraumatic, moist mucous membranes Neck: supple, non-tender without lymphadenopathy Cardiovascular: regular rate and rhythm without murmurs, rubs, or gallops, superior left-sided chest pain with palpation worse with movement Lungs: clear to auscultation bilaterally with normal work of breathing Abdomen: soft, non-tender, non-distended, normoactive bowel sounds Skin: warm, dry, no rashes or lesions, cap refill < 2 seconds Extremities: warm and well perfused, normal tone, no edema  Assessment & Plan   Encounter for annual physical exam Does have history of costochondritis.  Physical exam supports this.  Overall doing well.  Non-smoker. - Discussed conservative treatment with ibuprofen for non-cardiac  chest pain - Flu shot and tetanus administered - Checking CBC and CMET per patient request  Orders Placed This Encounter  Procedures  . CBC  . CMP14+EGFR   No orders of the defined types were placed in this encounter.   David McMullen, DO  Family Medicine, PGY-3 03/23/2018, 4:38 PM 

## 2018-03-23 NOTE — Patient Instructions (Signed)
Thank you for coming in to see us today. Please see below to review our plan for today's visit.  I will call you if there is anything abnormal in your blood work.  Please call the clinic at [phone] if your symptoms worsen or you have any concerns. It was our pleasure to serve you.  Durward Parcelavid Cai Anfinson, DO [facility], PGY-3

## 2018-03-24 LAB — CBC
HEMATOCRIT: 40.5 % (ref 34.0–46.6)
Hemoglobin: 14 g/dL (ref 11.1–15.9)
MCH: 30.2 pg (ref 26.6–33.0)
MCHC: 34.6 g/dL (ref 31.5–35.7)
MCV: 88 fL (ref 79–97)
PLATELETS: 410 10*3/uL (ref 150–450)
RBC: 4.63 x10E6/uL (ref 3.77–5.28)
RDW: 12 % — AB (ref 12.3–15.4)
WBC: 6 10*3/uL (ref 3.4–10.8)

## 2018-03-24 LAB — CMP14+EGFR
ALK PHOS: 64 IU/L (ref 39–117)
ALT: 21 IU/L (ref 0–32)
AST: 14 IU/L (ref 0–40)
Albumin/Globulin Ratio: 1.5 (ref 1.2–2.2)
Albumin: 4.5 g/dL (ref 3.5–5.5)
BUN/Creatinine Ratio: 12 (ref 9–23)
BUN: 8 mg/dL (ref 6–20)
Bilirubin Total: 0.2 mg/dL (ref 0.0–1.2)
CALCIUM: 9.4 mg/dL (ref 8.7–10.2)
CO2: 26 mmol/L (ref 20–29)
CREATININE: 0.66 mg/dL (ref 0.57–1.00)
Chloride: 99 mmol/L (ref 96–106)
GFR calc Af Amer: 138 mL/min/{1.73_m2} (ref >59)
GFR, EST NON AFRICAN AMERICAN: 120 mL/min/{1.73_m2} (ref >59)
GLOBULIN, TOTAL: 3 g/dL (ref 1.5–4.5)
GLUCOSE: 100 mg/dL — AB (ref 65–99)
Potassium: 4.3 mmol/L (ref 3.5–5.2)
SODIUM: 141 mmol/L (ref 134–144)
Total Protein: 7.5 g/dL (ref 6.0–8.5)

## 2018-03-25 ENCOUNTER — Encounter: Payer: Self-pay | Admitting: Family Medicine
